# Patient Record
Sex: Male | Born: 2000
Health system: Southern US, Community
[De-identification: ages and names within clinical notes are randomized; demographics above are authoritative.]

## PROBLEM LIST (undated history)

## (undated) HISTORY — PX: OTHER SURGICAL HISTORY: SHX169

---
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
## 2018-05-11 DIAGNOSIS — J101 Influenza due to other identified influenza virus with other respiratory manifestations: Secondary | ICD-10-CM | POA: Diagnosis not present

## 2018-12-25 DIAGNOSIS — Z1159 Encounter for screening for other viral diseases: Secondary | ICD-10-CM | POA: Diagnosis not present

## 2019-01-22 DIAGNOSIS — Z1159 Encounter for screening for other viral diseases: Secondary | ICD-10-CM | POA: Diagnosis not present

## 2019-03-10 DIAGNOSIS — R011 Cardiac murmur, unspecified: Secondary | ICD-10-CM | POA: Diagnosis not present

## 2019-03-10 DIAGNOSIS — U071 COVID-19: Secondary | ICD-10-CM | POA: Diagnosis not present

## 2019-03-11 DIAGNOSIS — U071 COVID-19: Secondary | ICD-10-CM | POA: Diagnosis not present

## 2019-03-11 DIAGNOSIS — R011 Cardiac murmur, unspecified: Secondary | ICD-10-CM | POA: Diagnosis not present

## 2019-03-29 DIAGNOSIS — Z03818 Encounter for observation for suspected exposure to other biological agents ruled out: Secondary | ICD-10-CM | POA: Diagnosis not present

## 2019-04-27 DIAGNOSIS — Z20828 Contact with and (suspected) exposure to other viral communicable diseases: Secondary | ICD-10-CM | POA: Diagnosis not present

## 2019-05-05 DIAGNOSIS — Z20828 Contact with and (suspected) exposure to other viral communicable diseases: Secondary | ICD-10-CM | POA: Diagnosis not present

## 2019-05-12 DIAGNOSIS — Z20828 Contact with and (suspected) exposure to other viral communicable diseases: Secondary | ICD-10-CM | POA: Diagnosis not present

## 2019-05-19 DIAGNOSIS — Z20828 Contact with and (suspected) exposure to other viral communicable diseases: Secondary | ICD-10-CM | POA: Diagnosis not present

## 2019-05-26 DIAGNOSIS — Z20828 Contact with and (suspected) exposure to other viral communicable diseases: Secondary | ICD-10-CM | POA: Diagnosis not present

## 2019-06-02 DIAGNOSIS — Z20828 Contact with and (suspected) exposure to other viral communicable diseases: Secondary | ICD-10-CM | POA: Diagnosis not present

## 2019-06-09 DIAGNOSIS — Z20828 Contact with and (suspected) exposure to other viral communicable diseases: Secondary | ICD-10-CM | POA: Diagnosis not present

## 2019-07-23 ENCOUNTER — Other Ambulatory Visit: Payer: Self-pay

## 2019-07-23 ENCOUNTER — Ambulatory Visit (INDEPENDENT_AMBULATORY_CARE_PROVIDER_SITE_OTHER): Payer: 59

## 2019-07-23 ENCOUNTER — Ambulatory Visit (INDEPENDENT_AMBULATORY_CARE_PROVIDER_SITE_OTHER): Payer: 59 | Admitting: Orthopaedic Surgery

## 2019-07-23 VITALS — BP 133/81 | HR 71 | Ht 70.0 in | Wt 258.0 lb

## 2019-07-23 DIAGNOSIS — M25562 Pain in left knee: Secondary | ICD-10-CM

## 2019-07-23 DIAGNOSIS — M79644 Pain in right finger(s): Secondary | ICD-10-CM

## 2019-07-23 DIAGNOSIS — S63632A Sprain of interphalangeal joint of right middle finger, initial encounter: Secondary | ICD-10-CM

## 2019-07-23 DIAGNOSIS — S63619A Unspecified sprain of unspecified finger, initial encounter: Secondary | ICD-10-CM | POA: Insufficient documentation

## 2019-07-23 NOTE — Progress Notes (Signed)
Office Visit Note   Patient: Carl Cannon           Date of Birth: March 17, 2000           MRN: [id number] Visit Date: 07/23/2019              Requested by: Elsie Lincoln, MD [place],  [place] [id number] PCP: Elsie Lincoln, MD   Assessment & Plan: Visit Diagnoses:  1. Pain in right finger(s)   2. Left knee pain, unspecified chronicity   3. Acute pain of left knee   4. Sprain of interphalangeal joint of right middle finger, initial encounter     Plan: Patient can buddy tape ring finger to the long finger to work on flexing all fingertips to get the last bit of flexion at the PIP joint.  We discussed changes on the plain radiograph with likely volar plate injury at the attachment to the middle phalanx and swelling should decrease he should have good function as long as he can get the rest of his flexion.  We discussed x-rays that showed some mild spurring both right and left knee medial compartment.  He can use some Aleve 2 p.o. twice daily with pain for short period of time a few weeks and then use ice before and after practice or games.  Follow-up if he has persistent symptoms.  Follow-Up Instructions: No follow-ups on file.   Orders:  Orders Placed This Encounter  Procedures  . XR Knee 1-2 Views Left  . XR Finger Middle Right   No orders of the defined types were placed in this encounter.     Procedures: No procedures performed   Clinical Data: No additional findings.   Subjective: Chief Complaint  Patient presents with  . Left Knee - Pain  . 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  He also jammed his right long finger 2 months ago and has noted persistent swelling at the PIP joint.  He denies having a dislocation no past history of injury to the finger  that he can remember.  Patient's been active healthy does have some exercise-induced asthma uses a Proventil had inhaler as needed and also some Zyrtec.  Review of Systems only surgery was tonsils and adenoids when he was little.  He has some mild asthma.  Otherwise 14 point systems negative as it pertains HPI.   Objective: Vital Signs: BP 133/81   Pulse 71   Ht 5\' 10"  (1.778 m)   Wt 258 lb (117 kg)   BMI 37.02 kg/m   Physical Exam Constitutional:      Appearance: He is well-developed.  HENT:     Head: Normocephalic and atraumatic.  Eyes:     Pupils: Pupils are equal, round, and reactive to light.  Neck:     Thyroid: No thyromegaly.     Trachea: No tracheal deviation.  Cardiovascular:     Rate and Rhythm: Normal rate.  Pulmonary:     Effort: Pulmonary effort is normal.     Breath sounds: No wheezing.  Abdominal:     General: Bowel sounds are normal.     Palpations: Abdomen is soft.  Skin:    General: Skin is warm and dry.     Capillary Refill: Capillary refill takes less than 2  seconds.  Neurological:     Mental Status: He is alert and oriented to person, place, and time.  Psychiatric:        Behavior: Behavior normal.        Thought Content: Thought content normal.        Judgment: Judgment normal.     Ortho Exam patient has a mild crepitus with knee range of motion some medial and well as lateral joint line tenderness.  Negative hyper extension test Pivot shift test negative negative posterior anterior drawer.  Pes bursa is normal.  Quad patellar tendon normal good patellar tracking.  Negative logroll of the hips.  Exam of his right long finger demonstrates fusiform swelling of the PIP joint he lacks 8 mm touching fingertip to palm.  He has full extension. Specialty Comments:  No specialty comments available.  Imaging: XR Knee 1-2 Views Left  Result Date: 07/23/2019 Standing AP both knees lateral left knee and sunrise patellar x-ray are obtained and reviewed.  This  shows some bilateral flattening of the medial femoral condyle with medial marginal osteophytes right and left knee. Impression: Mild degenerative changes right and left knee primarily medial compartment.  XR Finger Middle Right  Result Date: 07/23/2019 Three-view x-rays right long finger over obtained and reviewed.  This shows some changes in the middle phalanx at the volar plate attachment consistent with subacute avulsion injury.  Finger is located no joint space narrowing.  No other changes noted. Impression: Changes consistent with distal volar plate avulsion with slight calcification.    PMFS History: Patient Active Problem List   Diagnosis Date Noted  . Pain in left knee 07/23/2019  . Finger sprain 07/23/2019   Past Medical History:  Diagnosis Date  . Asthma     No family history on file.  Past Surgical History:  Procedure Laterality Date  . asthma     Social History   Occupational History  . Not on file  Tobacco Use  . Smoking status: Never Smoker  Substance and Sexual Activity  . Alcohol use: No  . Drug use: No  . Sexual activity: Not on file

## 2019-08-27 DIAGNOSIS — M25569 Pain in unspecified knee: Secondary | ICD-10-CM | POA: Diagnosis not present

## 2019-08-27 DIAGNOSIS — M25562 Pain in left knee: Secondary | ICD-10-CM | POA: Diagnosis not present

## 2019-08-27 DIAGNOSIS — S83282A Other tear of lateral meniscus, current injury, left knee, initial encounter: Secondary | ICD-10-CM | POA: Diagnosis not present

## 2019-09-11 DIAGNOSIS — T1490XA Injury, unspecified, initial encounter: Secondary | ICD-10-CM | POA: Diagnosis not present

## 2019-09-11 DIAGNOSIS — S83282A Other tear of lateral meniscus, current injury, left knee, initial encounter: Secondary | ICD-10-CM | POA: Diagnosis not present

## 2019-09-23 DIAGNOSIS — S83272A Complex tear of lateral meniscus, current injury, left knee, initial encounter: Secondary | ICD-10-CM | POA: Diagnosis not present

## 2019-09-23 DIAGNOSIS — M2242 Chondromalacia patellae, left knee: Secondary | ICD-10-CM | POA: Diagnosis not present

## 2019-09-23 DIAGNOSIS — M23201 Derangement of unspecified lateral meniscus due to old tear or injury, left knee: Secondary | ICD-10-CM | POA: Diagnosis not present

## 2019-10-13 DIAGNOSIS — Z9889 Other specified postprocedural states: Secondary | ICD-10-CM | POA: Diagnosis not present

## 2019-10-13 DIAGNOSIS — Z025 Encounter for examination for participation in sport: Secondary | ICD-10-CM | POA: Diagnosis not present

## 2019-10-13 DIAGNOSIS — R011 Cardiac murmur, unspecified: Secondary | ICD-10-CM | POA: Diagnosis not present

## 2020-01-26 DIAGNOSIS — M25462 Effusion, left knee: Secondary | ICD-10-CM | POA: Diagnosis not present

## 2020-01-28 DIAGNOSIS — M25562 Pain in left knee: Secondary | ICD-10-CM | POA: Diagnosis not present

## 2020-01-28 DIAGNOSIS — M84362A Stress fracture, left tibia, initial encounter for fracture: Secondary | ICD-10-CM | POA: Diagnosis not present

## 2020-03-22 DIAGNOSIS — S83281D Other tear of lateral meniscus, current injury, right knee, subsequent encounter: Secondary | ICD-10-CM | POA: Diagnosis not present

## 2020-06-16 DIAGNOSIS — M79672 Pain in left foot: Secondary | ICD-10-CM | POA: Diagnosis not present

## 2020-06-16 DIAGNOSIS — S93602A Unspecified sprain of left foot, initial encounter: Secondary | ICD-10-CM | POA: Diagnosis not present

## 2020-06-28 DIAGNOSIS — S93622D Sprain of tarsometatarsal ligament of left foot, subsequent encounter: Secondary | ICD-10-CM | POA: Diagnosis not present

## 2020-09-21 DIAGNOSIS — M79672 Pain in left foot: Secondary | ICD-10-CM | POA: Diagnosis not present

## 2020-12-29 DIAGNOSIS — M6752 Plica syndrome, left knee: Secondary | ICD-10-CM | POA: Diagnosis not present

## 2021-01-25 DIAGNOSIS — S4351XA Sprain of right acromioclavicular joint, initial encounter: Secondary | ICD-10-CM | POA: Diagnosis not present

## 2021-01-25 DIAGNOSIS — M25511 Pain in right shoulder: Secondary | ICD-10-CM | POA: Diagnosis not present

## 2021-03-24 DIAGNOSIS — S4351XA Sprain of right acromioclavicular joint, initial encounter: Secondary | ICD-10-CM | POA: Diagnosis not present

## 2021-03-24 DIAGNOSIS — M25511 Pain in right shoulder: Secondary | ICD-10-CM | POA: Diagnosis not present

## 2021-03-24 DIAGNOSIS — M25562 Pain in left knee: Secondary | ICD-10-CM | POA: Diagnosis not present

## 2021-03-24 DIAGNOSIS — M6752 Plica syndrome, left knee: Secondary | ICD-10-CM | POA: Diagnosis not present

## 2021-07-03 DIAGNOSIS — Z202 Contact with and (suspected) exposure to infections with a predominantly sexual mode of transmission: Secondary | ICD-10-CM | POA: Diagnosis not present
